# Patient Record
Sex: Male | Born: 1960 | Race: Asian | Marital: Single | Smoking: Never smoker
Health system: Southern US, Community
[De-identification: ages and names within clinical notes are randomized; demographics above are authoritative.]

## PROBLEM LIST (undated history)

## (undated) DIAGNOSIS — I10 Essential (primary) hypertension: Secondary | ICD-10-CM

## (undated) DIAGNOSIS — E119 Type 2 diabetes mellitus without complications: Secondary | ICD-10-CM

## (undated) DIAGNOSIS — E785 Hyperlipidemia, unspecified: Secondary | ICD-10-CM

## (undated) HISTORY — DX: Essential (primary) hypertension: I10

## (undated) HISTORY — DX: Hyperlipidemia, unspecified: E78.5

## (undated) HISTORY — DX: Type 2 diabetes mellitus without complications: E11.9

---

## 2013-06-15 ENCOUNTER — Ambulatory Visit: Payer: Self-pay | Admitting: Family Medicine

## 2013-06-15 VITALS — BP 96/70 | HR 102 | Temp 98.0°F | Resp 20 | Ht 64.65 in | Wt 178.5 lb

## 2013-06-15 DIAGNOSIS — L255 Unspecified contact dermatitis due to plants, except food: Secondary | ICD-10-CM

## 2013-06-15 DIAGNOSIS — L237 Allergic contact dermatitis due to plants, except food: Secondary | ICD-10-CM

## 2013-06-15 DIAGNOSIS — I10 Essential (primary) hypertension: Secondary | ICD-10-CM | POA: Insufficient documentation

## 2013-06-15 MED ORDER — HYDROCHLOROTHIAZIDE 25 MG PO TABS: 25.0000 mg | ORAL_TABLET | Freq: Every day | ORAL | Status: DC

## 2013-06-15 MED ORDER — METHYLPREDNISOLONE ACETATE 80 MG/ML IJ SUSP
80.0000 mg | Freq: Once | INTRAMUSCULAR | Status: AC
  Administered 2013-06-15: 80 mg via INTRAMUSCULAR

## 2013-06-15 NOTE — Progress Notes (Signed)
° °  Subjective:    Patient ID: Albert Collins, male    DOB: November 22, 1960, 53 y.o.   MRN: [id number]  Poison Ivy Pertinent negatives include no fever.   Chief Complaint  Patient presents with   Poison Ivy    Arms, legs & torso   This chart was scribed for Elvina Sidle, MD by Andrew Au, ED Scribe. This patient was seen in room 2 and the patient's care was started at 5:00 PM.  HPI Comments: Albert Collins is a 53 y.o. male who presents to the [facility] complaining of poison ivy located on pt bilateral arms, legs and torso onset 1 week. Pt reports he was working out in the yard at that time. Pt denies rash being in between fingers. Pt is requesting the shot.  Pt reports he has HTN and is requesting a medication refill.   No past medical history on file. No Known Allergies Prior to Admission medications   Not on File   Review of Systems  Constitutional: Negative for fever and chills.  Skin: Positive for rash.    Objective:   Physical Exam  Nursing note and vitals reviewed. Constitutional: He is oriented to person, place, and time. He appears well-developed and well-nourished. No distress.  HENT:  Head: Normocephalic and atraumatic.  Eyes: EOM are normal.  Neck: Neck supple. No tracheal deviation present.  Cardiovascular: Normal rate.   Pulmonary/Chest: Effort normal.  Musculoskeletal: Normal range of motion.  Neurological: He is alert and oriented to person, place, and time.  Skin: Skin is warm and dry.  Vesicular papular rash forearms, abdomen, and lower extremities.  Psychiatric: He has a normal mood and affect. His behavior is normal.   Assessment & Plan:    Poison ivy - Plan: methylPREDNISolone acetate (DEPO-MEDROL) injection 80 mg  Hypertension - Plan: hydrochlorothiazide (HYDRODIURIL) 25 MG tablet  Signed, Elvina Sidle, MD

## 2014-04-01 ENCOUNTER — Ambulatory Visit (INDEPENDENT_AMBULATORY_CARE_PROVIDER_SITE_OTHER): Payer: Self-pay | Admitting: Family Medicine

## 2014-04-01 VITALS — BP 128/84 | HR 112 | Temp 98.0°F | Resp 18 | Ht 65.5 in | Wt 171.0 lb

## 2014-04-01 DIAGNOSIS — E119 Type 2 diabetes mellitus without complications: Secondary | ICD-10-CM

## 2014-04-01 DIAGNOSIS — I10 Essential (primary) hypertension: Secondary | ICD-10-CM

## 2014-04-01 DIAGNOSIS — Z131 Encounter for screening for diabetes mellitus: Secondary | ICD-10-CM

## 2014-04-01 DIAGNOSIS — M25512 Pain in left shoulder: Secondary | ICD-10-CM

## 2014-04-01 LAB — POCT GLYCOSYLATED HEMOGLOBIN (HGB A1C): HEMOGLOBIN A1C: 10

## 2014-04-01 LAB — POCT URINALYSIS DIPSTICK
Bilirubin, UA: NEGATIVE
Blood, UA: NEGATIVE
Glucose, UA: 500
KETONES UA: NEGATIVE
Leukocytes, UA: NEGATIVE
Nitrite, UA: NEGATIVE
PH UA: 5.5
Spec Grav, UA: 1.01
Urobilinogen, UA: 0.2

## 2014-04-01 LAB — GLUCOSE, POCT (MANUAL RESULT ENTRY)

## 2014-04-01 MED ORDER — MELOXICAM 7.5 MG PO TABS: 7.5000 mg | ORAL_TABLET | Freq: Every day | ORAL | Status: DC

## 2014-04-01 MED ORDER — LISINOPRIL 10 MG PO TABS: 10.0000 mg | ORAL_TABLET | Freq: Every day | ORAL | Status: DC

## 2014-04-01 MED ORDER — HYDROCHLOROTHIAZIDE 25 MG PO TABS: 25.0000 mg | ORAL_TABLET | Freq: Every day | ORAL | Status: DC

## 2014-04-01 MED ORDER — METFORMIN HCL 1000 MG PO TABS: 1000.0000 mg | ORAL_TABLET | Freq: Two times a day (BID) | ORAL | Status: DC

## 2014-04-01 NOTE — Patient Instructions (Signed)
- Please start Metformin to manage your newly diagnosed Diabetes.  Week 1: Take  (1/2 tablet) twice a day with food.  Week 2: Take 1,000mg  (1 tablet) in the morning with food and  (1/2 tablet) in the evening with food.  Week 3: Take 1,000mg  twice daily with food. - Please return for follow up in 3 months. - If you start,    Type 2 Diabetes Mellitus Type 2 diabetes mellitus, often simply referred to as type 2 diabetes, is a long-lasting (chronic) disease. In type 2 diabetes, the pancreas does not make enough insulin (a hormone), the cells are less responsive to the insulin that is made (insulin resistance), or both. Normally, insulin moves sugars from food into the tissue cells. The tissue cells use the sugars for energy. The lack of insulin or the lack of normal response to insulin causes excess sugars to build up in the blood instead of going into the tissue cells. As a result, high blood sugar (hyperglycemia) develops. The effect of high sugar (glucose) levels can cause many complications. Type 2 diabetes was also previously called adult-onset diabetes, but it can occur at any age.  RISK FACTORS  A person is predisposed to developing type 2 diabetes if someone in the family has the disease and also has one or more of the following primary risk factors:  Overweight.  An inactive lifestyle.  A history of consistently eating high-calorie foods. Maintaining a normal weight and regular physical activity can reduce the chance of developing type 2 diabetes. SYMPTOMS  A person with type 2 diabetes may not show symptoms initially. The symptoms of type 2 diabetes appear slowly. The symptoms include:  Increased thirst (polydipsia).  Increased urination (polyuria).  Increased urination during the night (nocturia).  Weight loss. This weight loss may be rapid.  Frequent, recurring infections.  Tiredness (fatigue).  Weakness.  Vision changes, such as blurred vision.  Fruity  smell to your breath.  Abdominal pain.  Nausea or vomiting.  Cuts or bruises which are slow to heal.  Tingling or numbness in the hands or feet. DIAGNOSIS Type 2 diabetes is frequently not diagnosed until complications of diabetes are present. Type 2 diabetes is diagnosed when symptoms or complications are present and when blood glucose levels are increased. Your blood glucose level may be checked by one or more of the following blood tests:  A fasting blood glucose test. You will not be allowed to eat for at least 8 hours before a blood sample is taken.  A random blood glucose test. Your blood glucose is checked at any time of the day regardless of when you ate.  A hemoglobin A1c blood glucose test. A hemoglobin A1c test provides information about blood glucose control over the previous 3 months.  An oral glucose tolerance test (OGTT). Your blood glucose is measured after you have not eaten (fasted) for 2 hours and then after you drink a glucose-containing beverage. TREATMENT   You may need to take insulin or diabetes medicine daily to keep blood glucose levels in the desired range.  If you use insulin, you may need to adjust the dosage depending on the carbohydrates that you eat with each meal or snack. The treatment goal is to maintain the before meal blood sugar (preprandial glucose) level at 70-130 mg/dL. HOME CARE INSTRUCTIONS   Have your hemoglobin A1c level checked twice a year.  Perform daily blood glucose monitoring as directed by your health care provider.  Monitor urine ketones when you are  ill and as directed by your health care provider.  Take your diabetes medicine or insulin as directed by your health care provider to maintain your blood glucose levels in the desired range.  Never run out of diabetes medicine or insulin. It is needed every day.  If you are using insulin, you may need to adjust the amount of insulin given based on your intake of carbohydrates.  Carbohydrates can raise blood glucose levels but need to be included in your diet. Carbohydrates provide vitamins, minerals, and fiber which are an essential part of a healthy diet. Carbohydrates are found in fruits, vegetables, whole grains, dairy products, legumes, and foods containing added sugars.  Eat healthy foods. You should make an appointment to see a registered dietitian to help you create an eating plan that is right for you.  Lose weight if you are overweight.  Carry a medical alert card or wear your medical alert jewelry.  Carry a 15-gram carbohydrate snack with you at all times to treat low blood glucose (hypoglycemia). Some examples of 15-gram carbohydrate snacks include:  Glucose tablets, 3 or 4.  Glucose gel, 15-gram tube.  Raisins, 2 tablespoons (24 grams).  Jelly beans, 6.  Animal crackers, 8.  Regular pop, 4 ounces (120 mL).  Gummy treats, 9.  Recognize hypoglycemia. Hypoglycemia occurs with blood glucose levels of 70 mg/dL and below. The risk for hypoglycemia increases when fasting or skipping meals, during or after intense exercise, and during sleep. Hypoglycemia symptoms can include:  Tremors or shakes.  Decreased ability to concentrate.  Sweating.  Increased heart rate.  Headache.  Dry mouth.  Hunger.  Irritability.  Anxiety.  Restless sleep.  Altered speech or coordination.  Confusion.  Treat hypoglycemia promptly. If you are alert and able to safely swallow, follow the 15:15 rule:  Take 15-20 grams of rapid-acting glucose or carbohydrate. Rapid-acting options include glucose gel, glucose tablets, or 4 ounces (120 mL) of fruit juice, regular soda, or low-fat milk.  Check your blood glucose level 15 minutes after taking the glucose.  Take 15-20 grams more of glucose if the repeat blood glucose level is still 70 mg/dL or below.  Eat a meal or snack within 1 hour once blood glucose levels return to normal.  Be alert to feeling very  thirsty and urinating more frequently than usual, which are early signs of hyperglycemia. An early awareness of hyperglycemia allows for prompt treatment. Treat hyperglycemia as directed by your health care provider.  Engage in at least 150 minutes of moderate-intensity physical activity a week, spread over at least 3 days of the week or as directed by your health care provider. In addition, you should engage in resistance exercise at least 2 times a week or as directed by your health care provider. Try to spend no more than 90 minutes at one time inactive.  Adjust your medicine and food intake as needed if you start a new exercise or sport.  Follow your sick-day plan anytime you are unable to eat or drink as usual.  Do not use any tobacco products including cigarettes, chewing tobacco, or electronic cigarettes. If you need help quitting, ask your health care provider.  Limit alcohol intake to no more than 1 drink per day for nonpregnant women and 2 drinks per day for men. You should drink alcohol only when you are also eating food. Talk with your health care provider whether alcohol is safe for you. Tell your health care provider if you drink alcohol several times a  week.  Keep all follow-up visits as directed by your health care provider. This is important.  Schedule an eye exam soon after the diagnosis of type 2 diabetes and then annually.  Perform daily skin and foot care. Examine your skin and feet daily for cuts, bruises, redness, nail problems, bleeding, blisters, or sores. A foot exam by a health care provider should be done annually.  Brush your teeth and gums at least twice a day and floss at least once a day. Follow up with your dentist regularly.  Share your diabetes management plan with your workplace or school.  Stay up-to-date with immunizations. It is recommended that people with diabetes who are over 53 years old get the pneumonia vaccine. In some cases, two separate shots may  be given. Ask your health care provider if your pneumonia vaccination is up-to-date.  Learn to manage stress.  Obtain ongoing diabetes education and support as needed.  Participate in or seek rehabilitation as needed to maintain or improve independence and quality of life. Request a physical or occupational therapy referral if you are having foot or hand numbness, or difficulties with grooming, dressing, eating, or physical activity. SEEK MEDICAL CARE IF:   You are unable to eat food or drink fluids for more than 6 hours.  You have nausea and vomiting for more than 6 hours.  Your blood glucose level is over 240 mg/dL.  There is a change in mental status.  You develop an additional serious illness.  You have diarrhea for more than 6 hours.  You have been sick or have had a fever for a couple of days and are not getting better.  You have pain during any physical activity.  SEEK IMMEDIATE MEDICAL CARE IF:  You have difficulty breathing.  You have moderate to large ketone levels. MAKE SURE YOU:  Understand these instructions.  Will watch your condition.  Will get help right away if you are not doing well or get worse. Document Released: 01/02/2005 Document Revised: 05/19/2013 Document Reviewed: 08/01/2011 [facility] Patient Information 2015 [employer] [place]. This information is not intended to replace advice given to you by your health care provider. Make sure you discuss any questions you have with your health care provider.

## 2014-04-01 NOTE — Progress Notes (Signed)
Have reviewed note and examined pt along with Mr. Albert Collins, agree with his assessment and plan.  Suspect that although glucose is quite high pt is well compensated and does not require admission,  Await BMP to calculate anion gap.  No ketones in urine however.  Rechecked pulse and found to be approx 108

## 2014-04-01 NOTE — Progress Notes (Signed)
MRN: [id number] DOB: 04/23/1960  Subjective:   Albert Collins is a 54 y.o. male presenting for chief complaint of dm check  Diabetes - [place], urine sample was checked and advised that he should be evaluated for diabetes. Works as a Hospital doctordriver, eats mix of foods, leans meats, rice, vegetables, drinks plenty of water, no sodas. Patient exercises 15-30 minutes walking or jogging daily. Denies polyuria, polydipsia, polyphagia, numbness, tingling, cuts or wounds that won't heal, blurred vision. Denies family history of diabetes. Denies smoking, 1-2 beers on the weekends. Denies any other aggravating or relieving factors, no other questions or concerns.  HTN - diagnosed with HTN 2006, managed with HCT, needs a refill of his medication, takes this daily. Avoids salt in his diet. Diet and exercise as above. Denies lightheadedness, dizziness, chest pain, shob, lower leg swelling.   Shoulder pain - reports 2 week history of left shoulder pain. Patient remembers lifting a 20lb bag of ice and felt pain since then. Pain is sharp, intermittent, occurs daily, does not radiate, elicited with movement. He has tried Advil and Tylenol with minimal relief. Denies history of trauma, injury or shoulder surgery.  Manus GunningChak has a current medication list which includes the following prescription(s): hydrochlorothiazide. He has No Known Allergies.  Manus GunningChak  has a past medical history of Hypertension and Diabetes mellitus without complication. Also  has no past surgical history on file.  ROS As in subjective.  Objective:   Vitals: BP 128/84 mmHg  Pulse 112  Temp(Src) 98 F (36.7 C) (Oral)  Resp 18  Ht 5' 5.5" (1.664 m)  Wt 171 lb (77.565 kg)  BMI 28.01 kg/m2  SpO2 96%  Pulse 96 on recheck by Dr. Patsy Lageropland at 17:10.  Physical Exam  Constitutional: He is oriented to person, place, and time and well-developed, well-nourished, and in no distress.  Neck: Normal range of motion.  No  spasms or spinous process tenderness.  Cardiovascular: Regular rhythm and intact distal pulses.  Exam reveals no gallop and no friction rub.   No murmur heard. Pulmonary/Chest: No respiratory distress. He has no wheezes. He has no rales. He exhibits no tenderness.  Abdominal: Soft. Bowel sounds are normal. He exhibits no distension and no mass. There is no tenderness.  Musculoskeletal:       Left shoulder: He exhibits normal range of motion, no tenderness, no bony tenderness, no crepitus, no deformity, no pain and no spasm.  Negative Neers and Hawkins test. Strength 5/5.  Neurological: He is alert and oriented to person, place, and time.   Results for orders placed or performed in visit on 04/01/14 (from the past 24 hour(s))  POCT glucose (manual entry)     Status: None   Collection Time: 04/01/14  5:00 PM  Result Value Ref Range   POC Glucose >=444 70 - 99 mg/dl  POCT urinalysis dipstick     Status: None   Collection Time: 04/01/14  5:00 PM  Result Value Ref Range   Color, UA yellow    Clarity, UA clear    Glucose, UA 500    Bilirubin, UA neg    Ketones, UA neg    Spec Grav, UA 1.010    Blood, UA neg    pH, UA 5.5    Protein, UA trace    Urobilinogen, UA 0.2    Nitrite, UA neg    Leukocytes, UA Negative   POCT glycosylated hemoglobin (Hb A1C)     Status:  None   Collection Time: 04/01/14  5:10 PM  Result Value Ref Range   Hemoglobin A1C 10.0    Assessment and Plan :   1. Essential hypertension - Controlled, continue HCT and add lisinopril for renal protection - Continue healthy diet and exercise - When making recommendations for dietary modifications, patient admitted that he drinks 6-7 beers on Saturday and Sunday, also admits ~2 drinks of alcohol daily during weekdays. Advised that he cut back on his drinking and eventually stop all together. Patient agreed. - Follow up in 1 month  2. Diabetes mellitus screening 3. New onset type 2 diabetes mellitus - Start Metformin,  titrate up to 2,000mg  daily. - Diet, exercise, follow up as above. - Labs pending, consider hospitalization if blood sugar is >700  4. Pain in joint, shoulder region, left - Meloxicam as needed  Wallis Bamberg, PA-C [facility] [phone] 04/01/2014 4:20 PM

## 2014-04-02 LAB — BASIC METABOLIC PANEL
BUN: 14 mg/dL (ref 6–23)
CO2: 23 meq/L (ref 19–32)
Calcium: 9.9 mg/dL (ref 8.4–10.5)
Chloride: 95 mEq/L — ABNORMAL LOW (ref 96–112)
Creat: 1.11 mg/dL (ref 0.50–1.35)
Glucose, Bld: 411 mg/dL — ABNORMAL HIGH (ref 70–99)
Potassium: 3.8 mEq/L (ref 3.5–5.3)
Sodium: 130 mEq/L — ABNORMAL LOW (ref 135–145)

## 2014-04-03 ENCOUNTER — Telehealth: Payer: Self-pay | Admitting: Urgent Care

## 2014-04-03 NOTE — Telephone Encounter (Signed)
Spoke with patient regarding results and progress. Patient states that he is doing fine, no change in symptoms. He started Metformin. Will continue with this medication and BP meds. Follow up in 1 month.  Albert BambergMario Imran Nuon, PA-C [facility] [phone] 04/03/2014  2:34 PM

## 2014-08-24 ENCOUNTER — Other Ambulatory Visit: Payer: Self-pay | Admitting: Urgent Care

## 2014-08-25 NOTE — Telephone Encounter (Signed)
I'll give 1 more refill but this medication is not meant to be used long term. Please let patient know that we should re-evaluate him if his joint pain persists. Thank you!

## 2014-08-25 NOTE — Telephone Encounter (Signed)
Mike, do you want to give RFs? 

## 2015-12-06 ENCOUNTER — Emergency Department (HOSPITAL_COMMUNITY)
Admission: EM | Admit: 2015-12-06 | Discharge: 2015-12-06 | Disposition: A | Discharge status: Home / Self Care | Payer: BLUE CROSS/BLUE SHIELD | Attending: Emergency Medicine | Admitting: Emergency Medicine

## 2015-12-06 ENCOUNTER — Encounter (HOSPITAL_COMMUNITY): Payer: Self-pay | Admitting: Vascular Surgery

## 2015-12-06 ENCOUNTER — Emergency Department (HOSPITAL_COMMUNITY): Payer: BLUE CROSS/BLUE SHIELD

## 2015-12-06 DIAGNOSIS — Z79899 Other long term (current) drug therapy: Secondary | ICD-10-CM | POA: Diagnosis not present

## 2015-12-06 DIAGNOSIS — Y939 Activity, unspecified: Secondary | ICD-10-CM | POA: Insufficient documentation

## 2015-12-06 DIAGNOSIS — S3992XA Unspecified injury of lower back, initial encounter: Secondary | ICD-10-CM | POA: Insufficient documentation

## 2015-12-06 DIAGNOSIS — Z7984 Long term (current) use of oral hypoglycemic drugs: Secondary | ICD-10-CM | POA: Diagnosis not present

## 2015-12-06 DIAGNOSIS — M542 Cervicalgia: Secondary | ICD-10-CM | POA: Diagnosis not present

## 2015-12-06 DIAGNOSIS — Y92410 Unspecified street and highway as the place of occurrence of the external cause: Secondary | ICD-10-CM | POA: Insufficient documentation

## 2015-12-06 DIAGNOSIS — E119 Type 2 diabetes mellitus without complications: Secondary | ICD-10-CM | POA: Diagnosis not present

## 2015-12-06 DIAGNOSIS — I10 Essential (primary) hypertension: Secondary | ICD-10-CM | POA: Insufficient documentation

## 2015-12-06 DIAGNOSIS — Y999 Unspecified external cause status: Secondary | ICD-10-CM | POA: Diagnosis not present

## 2015-12-06 MED ORDER — IBUPROFEN 400 MG PO TABS
600.0000 mg | ORAL_TABLET | Freq: Once | ORAL | Status: AC
  Administered 2015-12-06: 600 mg via ORAL
  Filled 2015-12-06: qty 1

## 2015-12-06 MED ORDER — TRAMADOL HCL 50 MG PO TABS: 50.0000 mg | ORAL_TABLET | Freq: Four times a day (QID) | ORAL | 0 refills | Status: DC | PRN

## 2015-12-06 MED ORDER — NAPROXEN 500 MG PO TABS: 500.0000 mg | ORAL_TABLET | Freq: Two times a day (BID) | ORAL | 0 refills | Status: DC

## 2015-12-06 MED ORDER — METHOCARBAMOL 500 MG PO TABS: 500.0000 mg | ORAL_TABLET | Freq: Two times a day (BID) | ORAL | 0 refills | Status: DC

## 2015-12-06 NOTE — ED Notes (Signed)
Patient transported to X-ray 
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
## 2015-12-06 NOTE — ED Notes (Signed)
D/C papers reviewed and PA explained DC instructions. Understanding verbalized

## 2015-12-06 NOTE — Discharge Instructions (Signed)
Take your medications as prescribed as needed for pain relief. I also recommend applying ice and/or heat to affected area for 15-20 minutes 3-4 times daily for additional pain relief. Refrain from doing any heavy lifting, squatting or repetitive movements that exacerbate her symptoms for the next few days. Please follow up with a primary care provider from the Resource Guide provided below in one week if your symptoms have not improved. Please return to the Emergency Department if symptoms worsen or new onset of fever, numbness, tingling, groin anesthesia, loss of bowel or bladder, weakness, chest pain, difficulty breathing, abdominal pain,urinary retention.

## 2015-12-15 ENCOUNTER — Encounter: Payer: Self-pay | Admitting: Internal Medicine

## 2015-12-15 ENCOUNTER — Other Ambulatory Visit: Payer: Self-pay | Admitting: *Deleted

## 2015-12-15 ENCOUNTER — Ambulatory Visit: Payer: Self-pay | Attending: Internal Medicine | Admitting: Internal Medicine

## 2015-12-15 VITALS — BP 120/84 | HR 102 | Temp 98.2°F | Resp 18 | Ht 65.0 in | Wt 181.2 lb

## 2015-12-15 DIAGNOSIS — M545 Low back pain, unspecified: Secondary | ICD-10-CM

## 2015-12-15 MED ORDER — NAPROXEN 500 MG PO TABS: 500.0000 mg | ORAL_TABLET | Freq: Two times a day (BID) | ORAL | 0 refills | Status: DC

## 2015-12-15 MED ORDER — TRAMADOL HCL 50 MG PO TABS: 50.0000 mg | ORAL_TABLET | Freq: Four times a day (QID) | ORAL | 0 refills | Status: DC | PRN
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
## 2015-12-15 NOTE — Patient Instructions (Signed)
I suspect you r back pain will slowly resolve over the next 4-6 weeks.  Use ice alternating with heat as you are able.  Gradually increase your activity.

## 2015-12-15 NOTE — Addendum Note (Signed)
Addended by: Margaretmary LombardLISBON, Rasheedah Reis K on: 12/15/2015 03:46 PM   Modules accepted: Orders

## 2015-12-15 NOTE — Telephone Encounter (Signed)
Medication E-prescribed to the pharmacy.

## 2015-12-28 ENCOUNTER — Encounter (HOSPITAL_COMMUNITY): Payer: Self-pay | Admitting: *Deleted

## 2015-12-28 ENCOUNTER — Emergency Department (HOSPITAL_COMMUNITY): Payer: BLUE CROSS/BLUE SHIELD

## 2015-12-28 ENCOUNTER — Emergency Department (HOSPITAL_COMMUNITY)
Admission: EM | Admit: 2015-12-28 | Discharge: 2015-12-28 | Disposition: A | Discharge status: Home / Self Care | Payer: BLUE CROSS/BLUE SHIELD | Attending: Emergency Medicine | Admitting: Emergency Medicine

## 2015-12-28 DIAGNOSIS — Z79899 Other long term (current) drug therapy: Secondary | ICD-10-CM | POA: Diagnosis not present

## 2015-12-28 DIAGNOSIS — E119 Type 2 diabetes mellitus without complications: Secondary | ICD-10-CM | POA: Insufficient documentation

## 2015-12-28 DIAGNOSIS — Z7984 Long term (current) use of oral hypoglycemic drugs: Secondary | ICD-10-CM | POA: Insufficient documentation

## 2015-12-28 DIAGNOSIS — M06831 Other specified rheumatoid arthritis, right wrist: Secondary | ICD-10-CM | POA: Insufficient documentation

## 2015-12-28 DIAGNOSIS — I10 Essential (primary) hypertension: Secondary | ICD-10-CM | POA: Insufficient documentation

## 2015-12-28 DIAGNOSIS — M1990 Unspecified osteoarthritis, unspecified site: Secondary | ICD-10-CM

## 2015-12-28 DIAGNOSIS — M25531 Pain in right wrist: Secondary | ICD-10-CM | POA: Diagnosis present

## 2015-12-28 LAB — CBC WITH DIFFERENTIAL/PLATELET
BASOS ABS: 0 10*3/uL (ref 0.0–0.1)
BASOS PCT: 0 %
EOS PCT: 0 %
Eosinophils Absolute: 0 10*3/uL (ref 0.0–0.7)
HCT: 43.7 % (ref 39.0–52.0)
Hemoglobin: 15.2 g/dL (ref 13.0–17.0)
LYMPHS PCT: 26 %
Lymphs Abs: 3.2 10*3/uL (ref 0.7–4.0)
MCH: 27.8 pg (ref 26.0–34.0)
MCHC: 34.8 g/dL (ref 30.0–36.0)
MCV: 79.9 fL (ref 78.0–100.0)
Monocytes Absolute: 1.2 10*3/uL — ABNORMAL HIGH (ref 0.1–1.0)
Monocytes Relative: 10 %
Neutro Abs: 8 10*3/uL — ABNORMAL HIGH (ref 1.7–7.7)
Neutrophils Relative %: 64 %
PLATELETS: 202 10*3/uL (ref 150–400)
RBC: 5.47 MIL/uL (ref 4.22–5.81)
RDW: 13 % (ref 11.5–15.5)
WBC: 12.4 10*3/uL — AB (ref 4.0–10.5)

## 2015-12-28 LAB — BASIC METABOLIC PANEL
ANION GAP: 12 (ref 5–15)
BUN: 15 mg/dL (ref 6–20)
CO2: 21 mmol/L — ABNORMAL LOW (ref 22–32)
Calcium: 9.4 mg/dL (ref 8.9–10.3)
Chloride: 96 mmol/L — ABNORMAL LOW (ref 101–111)
Creatinine, Ser: 1.02 mg/dL (ref 0.61–1.24)
Glucose, Bld: 291 mg/dL — ABNORMAL HIGH (ref 65–99)
POTASSIUM: 4 mmol/L (ref 3.5–5.1)
SODIUM: 129 mmol/L — AB (ref 135–145)

## 2015-12-28 LAB — C-REACTIVE PROTEIN: CRP: 5 mg/dL — AB (ref <1.0)

## 2015-12-28 LAB — SEDIMENTATION RATE: SED RATE: 28 mm/h — AB (ref 0–16)

## 2015-12-28 LAB — URIC ACID: URIC ACID, SERUM: 5.9 mg/dL (ref 4.4–7.6)

## 2015-12-28 MED ORDER — CEPHALEXIN 250 MG PO CAPS
500.0000 mg | ORAL_CAPSULE | Freq: Once | ORAL | Status: AC
  Administered 2015-12-28: 500 mg via ORAL
  Filled 2015-12-28: qty 2

## 2015-12-28 MED ORDER — HYDROCODONE-ACETAMINOPHEN 5-325 MG PO TABS
1.0000 | ORAL_TABLET | ORAL | Status: AC
  Administered 2015-12-28: 1 via ORAL
  Filled 2015-12-28: qty 1

## 2015-12-28 MED ORDER — ETODOLAC 500 MG PO TABS: 500.0000 mg | ORAL_TABLET | Freq: Two times a day (BID) | ORAL | 0 refills | Status: DC

## 2015-12-28 MED ORDER — CEPHALEXIN 500 MG PO CAPS: 500.0000 mg | ORAL_CAPSULE | Freq: Four times a day (QID) | ORAL | 0 refills | Status: DC

## 2015-12-28 MED ORDER — HYDROCODONE-ACETAMINOPHEN 5-325 MG PO TABS: 1.0000 | ORAL_TABLET | ORAL | 0 refills | Status: DC | PRN
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
## 2015-12-28 NOTE — Progress Notes (Signed)
Orthopedic Tech Progress Note Patient Details:  Albert Collins 01/15/1961 [id number]  Ortho Devices Type of Ortho Device: Velcro wrist splint Ortho Device/Splint Location: RUE Ortho Device/Splint Interventions: Ordered, Application   Jennye MoccasinHughes, Latausha Flamm Craig 12/28/2015, 3:13 PM

## 2015-12-28 NOTE — ED Notes (Signed)
Pt is aware of high blood pressure. Stated that he did not take his BP meds this morning.

## 2015-12-28 NOTE — Discharge Instructions (Signed)
Follow up with your primary care doctor later this week to be rechecked, take the medications as prescribed, return for fever, worsening symptoms
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
## 2015-12-28 NOTE — ED Notes (Signed)
Pt verbalized understanding discharge instructions and denies any further needs or questions at this time. VS stable, ambulatory and steady gait.   
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
## 2020-08-09 ENCOUNTER — Other Ambulatory Visit: Payer: Self-pay | Admitting: Physician Assistant

## 2020-08-09 DIAGNOSIS — R7989 Other specified abnormal findings of blood chemistry: Secondary | ICD-10-CM

## 2020-08-24 ENCOUNTER — Other Ambulatory Visit: Payer: Self-pay

## 2020-08-24 ENCOUNTER — Ambulatory Visit
Admission: RE | Admit: 2020-08-24 | Discharge: 2020-08-24 | Disposition: A | Discharge status: Home / Self Care | Payer: 59 | Source: Ambulatory Visit | Attending: Physician Assistant | Admitting: Physician Assistant

## 2020-08-24 DIAGNOSIS — R7989 Other specified abnormal findings of blood chemistry: Secondary | ICD-10-CM

## 2022-01-26 ENCOUNTER — Encounter: Payer: Self-pay | Admitting: Family Medicine

## 2022-01-26 ENCOUNTER — Ambulatory Visit: Payer: 59 | Admitting: Family Medicine

## 2022-01-26 VITALS — BP 156/94 | HR 70 | Temp 97.0°F | Ht 65.0 in | Wt 160.0 lb

## 2022-01-26 DIAGNOSIS — R051 Acute cough: Secondary | ICD-10-CM | POA: Diagnosis not present

## 2022-01-26 DIAGNOSIS — I10 Essential (primary) hypertension: Secondary | ICD-10-CM

## 2022-01-26 DIAGNOSIS — E119 Type 2 diabetes mellitus without complications: Secondary | ICD-10-CM | POA: Diagnosis not present

## 2022-01-26 DIAGNOSIS — K7581 Nonalcoholic steatohepatitis (NASH): Secondary | ICD-10-CM | POA: Insufficient documentation

## 2022-01-26 DIAGNOSIS — R0981 Nasal congestion: Secondary | ICD-10-CM

## 2022-01-26 DIAGNOSIS — M1A0720 Idiopathic chronic gout, left ankle and foot, without tophus (tophi): Secondary | ICD-10-CM

## 2022-01-26 DIAGNOSIS — I152 Hypertension secondary to endocrine disorders: Secondary | ICD-10-CM | POA: Insufficient documentation

## 2022-01-26 DIAGNOSIS — E782 Mixed hyperlipidemia: Secondary | ICD-10-CM

## 2022-01-26 MED ORDER — FLUTICASONE PROPIONATE 50 MCG/ACT NA SUSP: 2.0000 | Freq: Every day | NASAL | 6 refills | Status: DC

## 2022-01-26 MED ORDER — METFORMIN HCL 1000 MG PO TABS: 1000.0000 mg | ORAL_TABLET | Freq: Two times a day (BID) | ORAL | 3 refills | Status: DC

## 2022-01-26 MED ORDER — ALLOPURINOL 300 MG PO TABS: 300.0000 mg | ORAL_TABLET | Freq: Every day | ORAL | 0 refills | Status: DC

## 2022-01-26 MED ORDER — BLOOD GLUCOSE METER KIT: PACK | 0 refills | Status: DC

## 2022-01-26 MED ORDER — LISINOPRIL 10 MG PO TABS: 10.0000 mg | ORAL_TABLET | Freq: Every day | ORAL | 0 refills | Status: DC

## 2022-01-26 MED ORDER — BENZONATATE 200 MG PO CAPS: 200.0000 mg | ORAL_CAPSULE | Freq: Two times a day (BID) | ORAL | 0 refills | Status: DC | PRN

## 2022-01-26 MED ORDER — ROSUVASTATIN CALCIUM 40 MG PO TABS: 40.0000 mg | ORAL_TABLET | Freq: Every day | ORAL | 0 refills | Status: DC

## 2022-01-26 MED ORDER — LISINOPRIL 10 MG PO TABS: 10.0000 mg | ORAL_TABLET | Freq: Every day | ORAL | 3 refills | Status: DC

## 2022-01-26 MED ORDER — METFORMIN HCL 1000 MG PO TABS: 1000.0000 mg | ORAL_TABLET | Freq: Two times a day (BID) | ORAL | 0 refills | Status: DC

## 2022-01-26 NOTE — Progress Notes (Signed)
Assessment/Plan:   Problem List Items Addressed This Visit       Cardiovascular and Mediastinum   Essential hypertension    The patient will continue taking lisinopril 10 mg daily. He should monitor his blood pressure at home to ensure better management. It is recommended that he follows up in 2-3 months to recheck his blood pressure and review his medication efficacy and adherence. A repeat lipid panel and comprehensive metabolic panel will be ordered for his next visit.  Differential diagnosis: Consider secondary causes of hypertension if blood pressure remains uncontrolled on current management.  Plan: Encourage lifestyle modifications including diet and exercise. Continue with current medication and monitor blood pressure at home. Follow-up in 2-3 months for a re-evaluation of blood pressure and possible medication adjustment if needed.      Relevant Medications   lisinopril (ZESTRIL) 10 MG tablet   rosuvastatin (CRESTOR) 40 MG tablet   Other Relevant Orders   TSH   Lipid panel   Hemoglobin A1c   Microalbumin / creatinine urine ratio   Urinalysis, Routine w reflex microscopic   CBC with Differential/Platelet   Comprehensive metabolic panel     Endocrine   Type 2 diabetes mellitus without complication, without long-term current use of insulin (HCC) - Primary    The patient has been managing his diabetes with metformin. His glycemic control seems adequate based on the recent A1c of 7. He will continue with metformin and will commence the use of CBD as he desires, although there is no clinical evidence to support this as a treatment for diabetes. The patient will acquire a new blood glucose monitoring kit and is advised to maintain a log of his blood sugars, especially with his intention to use CBD. He will follow-up for routine diabetes monitoring.  Differential diagnosis: Exacerbation of diabetes control should be considered if A1c rises or there are signs of end-organ  damage.  Plan: Continue metformin. Provide education regarding the role of lifestyle management in diabetes. Dispense a new blood glucose monitor kit. Educate on the importance of regular blood glucose monitoring. Schedule follow-up in 3 months to review glycemic control and diabetes management.      Relevant Medications   metFORMIN (GLUCOPHAGE) 1000 MG tablet   blood glucose meter kit and supplies   lisinopril (ZESTRIL) 10 MG tablet   rosuvastatin (CRESTOR) 40 MG tablet   Other Relevant Orders   TSH   Lipid panel   Hemoglobin A1c   Microalbumin / creatinine urine ratio   Urinalysis, Routine w reflex microscopic   CBC with Differential/Platelet   Comprehensive metabolic panel     Musculoskeletal and Integument   Idiopathic chronic gout of left foot without tophus   Relevant Medications   allopurinol (ZYLOPRIM) 300 MG tablet   Other Relevant Orders   TSH   Lipid panel   Hemoglobin A1c   Microalbumin / creatinine urine ratio   Urinalysis, Routine w reflex microscopic   CBC with Differential/Platelet   Comprehensive metabolic panel   Uric acid     Other   Mixed hyperlipidemia   Relevant Medications   lisinopril (ZESTRIL) 10 MG tablet   rosuvastatin (CRESTOR) 40 MG tablet   Other Relevant Orders   TSH   Lipid panel   Hemoglobin A1c   Microalbumin / creatinine urine ratio   Urinalysis, Routine w reflex microscopic   CBC with Differential/Platelet   Comprehensive metabolic panel   Nasal congestion    Patient reports a runny nose and mild cough following  a flu-like illness one week ago. Over-the-counter medicines were previously obtained for symptomatic relief. Prescribe fluticasone nasal spray for congestion and benzonatate capsules for cough as needed.  Plan: Advise the patient to use nasal spray and cough medication as prescribed. Recommend rest, hydration, and symptomatic management with over-the-counter medications. Follow-up as needed or if symptoms worsen       Relevant Medications   fluticasone (FLONASE) 50 MCG/ACT nasal spray   Other Visit Diagnoses     New onset type 2 diabetes mellitus (HCC)       Relevant Medications   metFORMIN (GLUCOPHAGE) 1000 MG tablet   lisinopril (ZESTRIL) 10 MG tablet   rosuvastatin (CRESTOR) 40 MG tablet   Acute cough       Relevant Medications   benzonatate (TESSALON) 200 MG capsule       Medications Discontinued During This Encounter  Medication Reason   cephALEXin (KEFLEX) 500 MG capsule    etodolac (LODINE) 500 MG tablet    hydrochlorothiazide (HYDRODIURIL) 25 MG tablet    HYDROcodone-acetaminophen (NORCO/VICODIN) 5-325 MG tablet    traMADol (ULTRAM) 50 MG tablet    naproxen (NAPROSYN) 500 MG tablet    metFORMIN (GLUCOPHAGE) 1000 MG tablet Reorder   lisinopril (PRINIVIL,ZESTRIL) 10 MG tablet Reorder   allopurinol (ZYLOPRIM) 300 MG tablet Reorder   rosuvastatin (CRESTOR) 40 MG tablet Reorder   allopurinol (ZYLOPRIM) 300 MG tablet Reorder   lisinopril (ZESTRIL) 10 MG tablet Reorder   metFORMIN (GLUCOPHAGE) 1000 MG tablet Reorder   rosuvastatin (CRESTOR) 40 MG tablet Reorder   benzonatate (TESSALON) 200 MG capsule Reorder   fluticasone (FLONASE) 50 MCG/ACT nasal spray Reorder   allopurinol (ZYLOPRIM) 300 MG tablet Reorder   lisinopril (ZESTRIL) 10 MG tablet Reorder   rosuvastatin (CRESTOR) 40 MG tablet Reorder   blood glucose meter kit and supplies Reorder      Subjective:  HPI: Encounter date: 01/26/2022  Jacquise Halpin is a 62 y.o. male who has Type 2 diabetes mellitus without complication, without long-term current use of insulin ([place]); NASH (nonalcoholic steatohepatitis); Mixed hyperlipidemia; Idiopathic chronic gout of left foot without tophus; Essential hypertension; and Nasal congestion on their problem list..   He  has a past medical history of Diabetes mellitus without complication ([place]), Hyperlipidemia, and Hypertension..    CHIEF COMPLAINT: The patient presents today for an  establishment of care and reports nasal congestion present for one week.  HISTORY OF PRESENT ILLNESS:  Problem 1: The patient is a 62 year old male with a history of essential hypertension, currently managed with lisinopril. He mentions no recent changes in symptoms or medication side effects, but his blood pressure reading today is slightly elevated at 162/96. He is not currently monitoring his blood pressure at home. Blood work from a previous visit suggests adequate control of his condition. He also experienced flu-like symptoms approximately one week prior, which he believes may contribute to his elevated blood pressure due to residual congestion.  Problem 2: The patient also has type 2 diabetes mellitus without complication, which is usually controlled with metformin. His hemoglobin A1c was around 7 on his last check, indicating good diabetes control. Despite seeing promotional materials suggesting CBD as a potential cure for diabetes, the patient is advised to continue with his current management plan. He will continue with metformin and has decided to try CBD concurrently, monitoring the effects without stopping his prescribed medication.  REVIEW OF SYSTEMS: Cardiovascular: No chest pain, shortness of breath, or leg swelling. Respiratory: Residual cough and nasal congestion from recent flu-like  illness, no wheezing on examination. GI: No abdominal pain or nausea reported. GU: No urinary symptoms. MSK: No acute joint pain or gout flares. Neuro: No headaches, dizziness, or neurologic deficits noted. ENT: No ear pain or hearing loss. Runny nose noted. All other systems are reviewed and negative.  History reviewed. No pertinent surgical history.  Outpatient Medications Prior to Visit  Medication Sig Dispense Refill   allopurinol (ZYLOPRIM) 300 MG tablet 1 tab(s) orally once a day for 90 days     lisinopril (PRINIVIL,ZESTRIL) 10 MG tablet Take 1 tablet (10 mg total) by mouth daily. 90 tablet 3    metFORMIN (GLUCOPHAGE) 1000 MG tablet Take 1 tablet (1,000 mg total) by mouth 2 (two) times daily with a meal. 90 tablet 3   naproxen (NAPROSYN) 500 MG tablet Take 1 tablet (500 mg total) by mouth 2 (two) times daily. 30 tablet 0   rosuvastatin (CRESTOR) 40 MG tablet Take 40 mg by mouth daily.     cephALEXin (KEFLEX) 500 MG capsule Take 1 capsule (500 mg total) by mouth 4 (four) times daily. (Patient not taking: Reported on 01/26/2022) 28 capsule 0   etodolac (LODINE) 500 MG tablet Take 1 tablet (500 mg total) by mouth 2 (two) times daily. (Patient not taking: Reported on 01/26/2022) 20 tablet 0   hydrochlorothiazide (HYDRODIURIL) 25 MG tablet Take 1 tablet (25 mg total) by mouth daily. (Patient not taking: Reported on 01/26/2022) 90 tablet 3   HYDROcodone-acetaminophen (NORCO/VICODIN) 5-325 MG tablet Take 1 tablet by mouth every 4 (four) hours as needed. (Patient not taking: Reported on 01/26/2022) 12 tablet 0   traMADol (ULTRAM) 50 MG tablet Take 1 tablet (50 mg total) by mouth every 6 (six) hours as needed. (Patient not taking: Reported on 01/26/2022) 10 tablet 0   No facility-administered medications prior to visit.    History reviewed. No pertinent family history.  Social History   Socioeconomic History   Marital status: Single    Spouse name: Not on file   Number of children: Not on file   Years of education: Not on file   Highest education level: Not on file  Occupational History   Not on file  Tobacco Use   Smoking status: Never    Passive exposure: Never   Smokeless tobacco: Never  Vaping Use   Vaping Use: Never used  Substance and Sexual Activity   Alcohol use: Yes    Comment: occ   Drug use: No   Sexual activity: Not on file  Other Topics Concern   Not on file  Social History Narrative   Not on file   Social Determinants of Health   Financial Resource Strain: Not on file  Food Insecurity: Not on file  Transportation Needs: Not on file  Physical Activity: Not on file   Stress: Not on file  Social Connections: Not on file  Intimate Partner Violence: Not on file                                                                                                 Objective:  Physical Exam: BP Marland Kitchen)  156/94 (BP Location: Left Arm, Patient Position: Sitting, Cuff Size: Large)   Pulse 70   Temp (!) 97 F (36.1 C) (Temporal)   Ht 5\' 5"  (1.651 m)   Wt 160 lb (72.6 kg)   SpO2 97%   BMI 26.63 kg/m    General: No acute distress. Awake and conversant.  Eyes: Normal conjunctiva, anicteric. Round symmetric pupils.  ENT: Hearing grossly intact. No nasal discharge.  Neck: Neck is supple. No masses or thyromegaly.  Respiratory: Respirations are non-labored. No auditory wheezing.  Skin: Warm. No rashes or ulcers.  Psych: Alert and oriented. Cooperative, Appropriate mood and affect, Normal judgment.  CV: No cyanosis or JVD MSK: Normal ambulation. No clubbing  Neuro: Sensation and CN II-XII grossly normal.  ABD: Soft, non-tender, non-distended, no organomegaly       Alesia Banda, MD, MS

## 2022-01-26 NOTE — Patient Instructions (Signed)
Patient Instructions:  Continue taking your current medications as prescribed. This includes:  Metformin for diabetes Lisinopril for high blood pressure Medications for cholesterol management and gout prevention Monitor your blood glucose levels using the new blood sugar monitor that will be provided to you. Keep a log of your results to discuss at your next appointment.  Do not stop taking Metformin or any other prescribed medications without consulting your healthcare provider. While you are considering trying CBD oil, be aware that it is not a proven treatment for diabetes, and stopping your medication can pose risks to your health.  For your recent flu symptoms, continue using the over-the-counter cold and flu medication that has been helping. A prescription for a nasal spray and cough medicine will be given to address the congestion and cough if needed.  A follow-up appointment is advised in 2-3 months to conduct fasting blood work, reassess blood pressure, and review diabetes management. Please schedule this for March as requested.  For the Department of Transportation (DOT) physical examination, you will need to see a certified provider. You can contact the provider who previously did this for you or seek a certified provider through [facility] or at a location of your choice in [place].  It is also time for you to receive your next COVID-19 vaccine booster. Please schedule this appropriately.  If any severe symptoms arise or if you have any urgent concerns, don't hesitate to contact your healthcare provider or seek immediate medical care.  If you have any questions or issues with your medications, appointments, or health concerns, please contact the clinic for assistance. Remember to maintain a balanced diet, regular physical activity, and adequate hydration, which are essential components of managing your health conditions.

## 2022-01-30 DIAGNOSIS — R0981 Nasal congestion: Secondary | ICD-10-CM | POA: Insufficient documentation

## 2022-01-30 NOTE — Assessment & Plan Note (Signed)
The patient will continue taking lisinopril 10 mg daily. He should monitor his blood pressure at home to ensure better management. It is recommended that he follows up in 2-3 months to recheck his blood pressure and review his medication efficacy and adherence. A repeat lipid panel and comprehensive metabolic panel will be ordered for his next visit.  Differential diagnosis: Consider secondary causes of hypertension if blood pressure remains uncontrolled on current management.  Plan: Encourage lifestyle modifications including diet and exercise. Continue with current medication and monitor blood pressure at home. Follow-up in 2-3 months for a re-evaluation of blood pressure and possible medication adjustment if needed.

## 2022-01-30 NOTE — Assessment & Plan Note (Signed)
The patient has been managing his diabetes with metformin. His glycemic control seems adequate based on the recent A1c of 7. He will continue with metformin and will commence the use of CBD as he desires, although there is no clinical evidence to support this as a treatment for diabetes. The patient will acquire a new blood glucose monitoring kit and is advised to maintain a log of his blood sugars, especially with his intention to use CBD. He will follow-up for routine diabetes monitoring.  Differential diagnosis: Exacerbation of diabetes control should be considered if A1c rises or there are signs of end-organ damage.  Plan: Continue metformin. Provide education regarding the role of lifestyle management in diabetes. Dispense a new blood glucose monitor kit. Educate on the importance of regular blood glucose monitoring. Schedule follow-up in 3 months to review glycemic control and diabetes management.

## 2022-01-30 NOTE — Assessment & Plan Note (Signed)
Patient reports a runny nose and mild cough following a flu-like illness one week ago. Over-the-counter medicines were previously obtained for symptomatic relief. Prescribe fluticasone nasal spray for congestion and benzonatate capsules for cough as needed.  Plan: Advise the patient to use nasal spray and cough medication as prescribed. Recommend rest, hydration, and symptomatic management with over-the-counter medications. Follow-up as needed or if symptoms worsen

## 2022-02-17 ENCOUNTER — Other Ambulatory Visit: Payer: Self-pay | Admitting: Family Medicine

## 2022-02-17 NOTE — Telephone Encounter (Signed)
Chart supports rx. Last OV: 01/26/2022 Next OV: 05/15/2022

## 2022-04-19 ENCOUNTER — Other Ambulatory Visit: Payer: Self-pay | Admitting: Family Medicine

## 2022-04-19 ENCOUNTER — Telehealth: Payer: Self-pay | Admitting: Family Medicine

## 2022-04-19 DIAGNOSIS — R0981 Nasal congestion: Secondary | ICD-10-CM

## 2022-04-19 DIAGNOSIS — I10 Essential (primary) hypertension: Secondary | ICD-10-CM

## 2022-04-19 DIAGNOSIS — E119 Type 2 diabetes mellitus without complications: Secondary | ICD-10-CM

## 2022-04-19 DIAGNOSIS — E782 Mixed hyperlipidemia: Secondary | ICD-10-CM

## 2022-04-19 DIAGNOSIS — M1A0720 Idiopathic chronic gout, left ankle and foot, without tophus (tophi): Secondary | ICD-10-CM

## 2022-04-19 MED ORDER — ROSUVASTATIN CALCIUM 40 MG PO TABS
40.0000 mg | ORAL_TABLET | Freq: Every day | ORAL | 0 refills | Status: DC
Start: 2022-04-19 — End: 2022-04-28

## 2022-04-19 MED ORDER — ALLOPURINOL 300 MG PO TABS
300.0000 mg | ORAL_TABLET | Freq: Every day | ORAL | 0 refills | Status: DC
Start: 2022-04-19 — End: 2022-04-28

## 2022-04-19 NOTE — Telephone Encounter (Signed)
Chart supports rx. Last OV: 01/26/2022 Next OV: 05/15/2022  

## 2022-04-19 NOTE — Telephone Encounter (Signed)
Chart supports rx. Last OV: 01/26/2022 Next OV: 05/15/2022  Patient already picked up lisinopril refill on 04/17/2022

## 2022-04-19 NOTE — Telephone Encounter (Signed)
Last OV: 01/26/22 Next OV: 05/12/22 Medication:  Lisinopril 10mg , Allopurinol 300 mg, and Rosuvastatin 40 mg Pharmacy: [facility] on [address] states he is currently in town and wanted OV to refill meds. Informed him that PCP out of office. States he will try to make 4/29 OV but needs refill now.

## 2022-04-28 ENCOUNTER — Ambulatory Visit: Payer: 59 | Admitting: Family Medicine

## 2022-04-28 VITALS — BP 138/78 | HR 83 | Temp 97.6°F | Ht 65.0 in | Wt 161.6 lb

## 2022-04-28 DIAGNOSIS — I10 Essential (primary) hypertension: Secondary | ICD-10-CM

## 2022-04-28 DIAGNOSIS — E782 Mixed hyperlipidemia: Secondary | ICD-10-CM

## 2022-04-28 DIAGNOSIS — I152 Hypertension secondary to endocrine disorders: Secondary | ICD-10-CM | POA: Diagnosis not present

## 2022-04-28 DIAGNOSIS — Z1159 Encounter for screening for other viral diseases: Secondary | ICD-10-CM

## 2022-04-28 DIAGNOSIS — K7581 Nonalcoholic steatohepatitis (NASH): Secondary | ICD-10-CM

## 2022-04-28 DIAGNOSIS — M1A0720 Idiopathic chronic gout, left ankle and foot, without tophus (tophi): Secondary | ICD-10-CM

## 2022-04-28 DIAGNOSIS — R0981 Nasal congestion: Secondary | ICD-10-CM

## 2022-04-28 DIAGNOSIS — Z5181 Encounter for therapeutic drug level monitoring: Secondary | ICD-10-CM | POA: Diagnosis not present

## 2022-04-28 DIAGNOSIS — E1159 Type 2 diabetes mellitus with other circulatory complications: Secondary | ICD-10-CM

## 2022-04-28 DIAGNOSIS — Z125 Encounter for screening for malignant neoplasm of prostate: Secondary | ICD-10-CM | POA: Diagnosis not present

## 2022-04-28 DIAGNOSIS — E119 Type 2 diabetes mellitus without complications: Secondary | ICD-10-CM

## 2022-04-28 DIAGNOSIS — D509 Iron deficiency anemia, unspecified: Secondary | ICD-10-CM

## 2022-04-28 LAB — COMPREHENSIVE METABOLIC PANEL
ALT: 19 U/L (ref 0–53)
AST: 28 U/L (ref 0–37)
Albumin: 4.5 g/dL (ref 3.5–5.2)
Alkaline Phosphatase: 51 U/L (ref 39–117)
BUN: 18 mg/dL (ref 6–23)
CO2: 25 mEq/L (ref 19–32)
Calcium: 9.2 mg/dL (ref 8.4–10.5)
Chloride: 108 mEq/L (ref 96–112)
Creatinine, Ser: 1.01 mg/dL (ref 0.40–1.50)
GFR: 80.4 mL/min (ref >60.00)
Glucose, Bld: 149 mg/dL — ABNORMAL HIGH (ref 70–99)
Potassium: 4.2 mEq/L (ref 3.5–5.1)
Sodium: 143 mEq/L (ref 135–145)
Total Bilirubin: 0.5 mg/dL (ref 0.2–1.2)
Total Protein: 7.8 g/dL (ref 6.0–8.3)

## 2022-04-28 LAB — URINALYSIS, ROUTINE W REFLEX MICROSCOPIC
Bilirubin Urine: NEGATIVE
Hgb urine dipstick: NEGATIVE
Ketones, ur: NEGATIVE
Leukocytes,Ua: NEGATIVE
Nitrite: NEGATIVE
Specific Gravity, Urine: 1.025 (ref 1.000–1.030)
Urine Glucose: NEGATIVE
Urobilinogen, UA: 1 (ref 0.0–1.0)
pH: 6 (ref 5.0–8.0)

## 2022-04-28 LAB — TSH: TSH: 1.39 u[IU]/mL (ref 0.35–5.50)

## 2022-04-28 LAB — B12 AND FOLATE PANEL
Folate: 10.5 ng/mL (ref >5.9)
Vitamin B-12: 413 pg/mL (ref 211–911)

## 2022-04-28 LAB — CBC WITH DIFFERENTIAL/PLATELET
Basophils Absolute: 0 10*3/uL (ref 0.0–0.1)
Basophils Relative: 0.3 % (ref 0.0–3.0)
Eosinophils Absolute: 0.3 10*3/uL (ref 0.0–0.7)
Eosinophils Relative: 3.4 % (ref 0.0–5.0)
HCT: 36.2 % — ABNORMAL LOW (ref 39.0–52.0)
Hemoglobin: 11.4 g/dL — ABNORMAL LOW (ref 13.0–17.0)
Lymphocytes Relative: 43.2 % (ref 12.0–46.0)
Lymphs Abs: 3.4 10*3/uL (ref 0.7–4.0)
MCHC: 31.5 g/dL (ref 30.0–36.0)
MCV: 74.1 fl — ABNORMAL LOW (ref 78.0–100.0)
Monocytes Absolute: 0.7 10*3/uL (ref 0.1–1.0)
Monocytes Relative: 8.6 % (ref 3.0–12.0)
Neutro Abs: 3.5 10*3/uL (ref 1.4–7.7)
Neutrophils Relative %: 44.5 % (ref 43.0–77.0)
Platelets: 215 10*3/uL (ref 150.0–400.0)
RBC: 4.88 Mil/uL (ref 4.22–5.81)
RDW: 19.3 % — ABNORMAL HIGH (ref 11.5–15.5)
WBC: 7.8 10*3/uL (ref 4.0–10.5)

## 2022-04-28 LAB — POCT GLYCOSYLATED HEMOGLOBIN (HGB A1C): Hemoglobin A1C: 6.8 % — AB (ref 4.0–5.6)

## 2022-04-28 LAB — LIPID PANEL
Cholesterol: 126 mg/dL (ref 0–200)
HDL: 45.8 mg/dL (ref >39.00)
NonHDL: 80.62
Total CHOL/HDL Ratio: 3
Triglycerides: 211 mg/dL — ABNORMAL HIGH (ref 0.0–149.0)
VLDL: 42.2 mg/dL — ABNORMAL HIGH (ref 0.0–40.0)

## 2022-04-28 LAB — MICROALBUMIN / CREATININE URINE RATIO
Creatinine,U: 149.7 mg/dL
Microalb Creat Ratio: 10.9 mg/g (ref 0.0–30.0)
Microalb, Ur: 16.3 mg/dL — ABNORMAL HIGH (ref 0.0–1.9)

## 2022-04-28 LAB — URIC ACID: Uric Acid, Serum: 4 mg/dL (ref 4.0–7.8)

## 2022-04-28 LAB — PSA: PSA: 1.66 ng/mL (ref 0.10–4.00)

## 2022-04-28 LAB — LDL CHOLESTEROL, DIRECT: Direct LDL: 60 mg/dL

## 2022-04-28 MED ORDER — ALLOPURINOL 300 MG PO TABS
300.0000 mg | ORAL_TABLET | Freq: Every day | ORAL | 1 refills | Status: DC
Start: 2022-04-28 — End: 2022-12-12

## 2022-04-28 MED ORDER — ROSUVASTATIN CALCIUM 40 MG PO TABS
40.0000 mg | ORAL_TABLET | Freq: Every day | ORAL | 0 refills | Status: DC
Start: 2022-04-28 — End: 2022-12-12

## 2022-04-28 MED ORDER — ACCU-CHEK GUIDE VI STRP
ORAL_STRIP | 3 refills | Status: AC
Start: 2022-04-28 — End: ?

## 2022-04-28 MED ORDER — BLOOD GLUCOSE METER KIT
PACK | 0 refills | Status: AC
Start: 2022-04-28 — End: ?

## 2022-04-28 MED ORDER — ALLOPURINOL 300 MG PO TABS
300.0000 mg | ORAL_TABLET | Freq: Every day | ORAL | 0 refills | Status: DC
Start: 2022-04-28 — End: 2022-04-28

## 2022-04-28 MED ORDER — FLUTICASONE PROPIONATE 50 MCG/ACT NA SUSP
NASAL | 6 refills | Status: AC
Start: 2022-04-28 — End: ?

## 2022-04-28 MED ORDER — METFORMIN HCL 1000 MG PO TABS
1000.0000 mg | ORAL_TABLET | Freq: Two times a day (BID) | ORAL | 0 refills | Status: DC
Start: 2022-04-28 — End: 2023-02-27

## 2022-04-28 MED ORDER — LISINOPRIL-HYDROCHLOROTHIAZIDE 10-12.5 MG PO TABS
1.0000 | ORAL_TABLET | Freq: Every day | ORAL | 3 refills | Status: DC
Start: 2022-04-28 — End: 2023-05-09
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
## 2022-04-28 NOTE — Assessment & Plan Note (Signed)
Patient's diabetes remains under control  Continue Metformin 1000 MG twice daily with meals. Maintain routine blood glucose monitoring. Schedule annual diabetic eye exam to monitor for retinopathy (referred to ophthalmology).

## 2022-04-28 NOTE — Assessment & Plan Note (Signed)
Slightly elevated BP at 138/78 mmHg noted. Plan to adjust Antihypertensive medication to include Lisinopril-hydrochlorothiazide combination to achieve better BP control. Monitor blood pressure and adjust medication as needed.

## 2022-04-28 NOTE — Patient Instructions (Addendum)
We refilled your medications and checking labs as discussed.  For blood pressure, we are adding HCTZ to lisinopril.

## 2022-04-29 LAB — HEPATITIS C ANTIBODY: Hepatitis C Ab: NONREACTIVE

## 2022-05-02 LAB — VITAMIN D 1,25 DIHYDROXY
Vitamin D 1, 25 (OH)2 Total: 60 pg/mL (ref 18–72)
Vitamin D2 1, 25 (OH)2: <8 pg/mL
Vitamin D3 1, 25 (OH)2: 60 pg/mL

## 2022-05-02 LAB — HIV ANTIBODY (ROUTINE TESTING W REFLEX): HIV 1&2 Ab, 4th Generation: NONREACTIVE

## 2022-05-04 NOTE — Addendum Note (Signed)
Addended by: Fanny Bien B on: 05/04/2022 12:48 PM   Modules accepted: Orders

## 2022-05-12 ENCOUNTER — Other Ambulatory Visit (INDEPENDENT_AMBULATORY_CARE_PROVIDER_SITE_OTHER): Payer: 59

## 2022-05-12 ENCOUNTER — Other Ambulatory Visit: Payer: 59

## 2022-05-12 DIAGNOSIS — I10 Essential (primary) hypertension: Secondary | ICD-10-CM

## 2022-05-12 DIAGNOSIS — E782 Mixed hyperlipidemia: Secondary | ICD-10-CM | POA: Diagnosis not present

## 2022-05-12 DIAGNOSIS — M1A0720 Idiopathic chronic gout, left ankle and foot, without tophus (tophi): Secondary | ICD-10-CM

## 2022-05-12 DIAGNOSIS — E119 Type 2 diabetes mellitus without complications: Secondary | ICD-10-CM | POA: Diagnosis not present

## 2022-05-12 DIAGNOSIS — D509 Iron deficiency anemia, unspecified: Secondary | ICD-10-CM | POA: Diagnosis not present

## 2022-05-12 DIAGNOSIS — D508 Other iron deficiency anemias: Secondary | ICD-10-CM

## 2022-05-12 LAB — CBC WITH DIFFERENTIAL/PLATELET
Basophils Absolute: 0 10*3/uL (ref 0.0–0.1)
Basophils Relative: 0.2 % (ref 0.0–3.0)
Eosinophils Absolute: 0.3 10*3/uL (ref 0.0–0.7)
Eosinophils Relative: 4.2 % (ref 0.0–5.0)
HCT: 34.1 % — ABNORMAL LOW (ref 39.0–52.0)
Hemoglobin: 10.8 g/dL — ABNORMAL LOW (ref 13.0–17.0)
Lymphocytes Relative: 43.2 % (ref 12.0–46.0)
Lymphs Abs: 3.3 10*3/uL (ref 0.7–4.0)
MCHC: 31.8 g/dL (ref 30.0–36.0)
MCV: 73.2 fl — ABNORMAL LOW (ref 78.0–100.0)
Monocytes Absolute: 0.7 10*3/uL (ref 0.1–1.0)
Monocytes Relative: 9.1 % (ref 3.0–12.0)
Neutro Abs: 3.3 10*3/uL (ref 1.4–7.7)
Neutrophils Relative %: 43.3 % (ref 43.0–77.0)
Platelets: 210 10*3/uL (ref 150.0–400.0)
RBC: 4.65 Mil/uL (ref 4.22–5.81)
RDW: 18.5 % — ABNORMAL HIGH (ref 11.5–15.5)
WBC: 7.7 10*3/uL (ref 4.0–10.5)

## 2022-05-12 LAB — HEMOGLOBIN A1C: Hgb A1c MFr Bld: 7.1 % — ABNORMAL HIGH (ref 4.6–6.5)

## 2022-05-12 LAB — C-REACTIVE PROTEIN: CRP: <1 mg/dL (ref 0.5–20.0)

## 2022-05-12 LAB — SEDIMENTATION RATE: Sed Rate: 42 mm/hr — ABNORMAL HIGH (ref 0–20)

## 2022-05-13 LAB — IRON,TIBC AND FERRITIN PANEL
%SAT: 7 % (calc) — ABNORMAL LOW (ref 20–48)
Ferritin: 5 ng/mL — ABNORMAL LOW (ref 24–380)
Iron: 33 ug/dL — ABNORMAL LOW (ref 50–180)
TIBC: 465 mcg/dL (calc) — ABNORMAL HIGH (ref 250–425)

## 2022-05-13 LAB — HGB FRACTIONATION CASCADE

## 2022-05-15 ENCOUNTER — Ambulatory Visit: Payer: 59 | Admitting: Family Medicine

## 2022-05-15 LAB — PATHOLOGIST SMEAR REVIEW

## 2022-05-15 MED ORDER — IRON (FERROUS SULFATE) 325 (65 FE) MG PO TABS
325.0000 mg | ORAL_TABLET | Freq: Every day | ORAL | Status: AC
Start: 2022-05-15 — End: ?

## 2022-05-15 NOTE — Addendum Note (Signed)
Addended by: Garnette Gunner on: 05/15/2022 08:46 AM   Modules accepted: Orders

## 2022-05-18 ENCOUNTER — Ambulatory Visit: Payer: 59 | Admitting: Internal Medicine

## 2022-05-18 LAB — CELIAC DISEASE AB SCREEN W/RFX
Antigliadin Abs, IgA: 8 units (ref 0–19)
IgA/Immunoglobulin A, Serum: 382 mg/dL (ref 61–437)
Transglutaminase IgA: <2 U/mL (ref 0–3)

## 2022-05-18 LAB — HGB FRACTIONATION CASCADE
Hgb A2: 2.4 % (ref 1.8–3.2)
Hgb F: 0 % (ref 0.0–2.0)
Hgb S: 0 %

## 2022-10-27 ENCOUNTER — Telehealth: Payer: Self-pay | Admitting: Family Medicine

## 2022-10-27 ENCOUNTER — Ambulatory Visit: Payer: 59 | Admitting: Family Medicine

## 2022-10-27 DIAGNOSIS — E119 Type 2 diabetes mellitus without complications: Secondary | ICD-10-CM

## 2022-10-27 NOTE — Telephone Encounter (Signed)
 1st no show, letter sent via mail

## 2022-10-27 NOTE — Telephone Encounter (Signed)
Pt is a no show for an OV with Dr Janee Morn on 10/27/22, I sent a letter.

## 2022-12-12 ENCOUNTER — Other Ambulatory Visit: Payer: Self-pay | Admitting: Family Medicine

## 2022-12-12 DIAGNOSIS — E119 Type 2 diabetes mellitus without complications: Secondary | ICD-10-CM

## 2022-12-12 DIAGNOSIS — M1A0720 Idiopathic chronic gout, left ankle and foot, without tophus (tophi): Secondary | ICD-10-CM

## 2022-12-12 DIAGNOSIS — E782 Mixed hyperlipidemia: Secondary | ICD-10-CM

## 2022-12-12 DIAGNOSIS — I10 Essential (primary) hypertension: Secondary | ICD-10-CM

## 2022-12-12 NOTE — Telephone Encounter (Signed)
Sent note to pharmacy Patient needs an appointment for future refills

## 2023-01-12 ENCOUNTER — Other Ambulatory Visit: Payer: Self-pay | Admitting: Family Medicine

## 2023-01-12 DIAGNOSIS — I10 Essential (primary) hypertension: Secondary | ICD-10-CM

## 2023-01-12 DIAGNOSIS — E119 Type 2 diabetes mellitus without complications: Secondary | ICD-10-CM

## 2023-01-12 DIAGNOSIS — E782 Mixed hyperlipidemia: Secondary | ICD-10-CM

## 2023-01-12 DIAGNOSIS — M1A0720 Idiopathic chronic gout, left ankle and foot, without tophus (tophi): Secondary | ICD-10-CM

## 2023-02-14 ENCOUNTER — Other Ambulatory Visit: Payer: Self-pay | Admitting: Family Medicine

## 2023-02-14 DIAGNOSIS — E119 Type 2 diabetes mellitus without complications: Secondary | ICD-10-CM

## 2023-02-27 ENCOUNTER — Other Ambulatory Visit: Payer: Self-pay | Admitting: Family Medicine

## 2023-02-27 DIAGNOSIS — E119 Type 2 diabetes mellitus without complications: Secondary | ICD-10-CM

## 2023-05-09 ENCOUNTER — Other Ambulatory Visit: Payer: Self-pay | Admitting: Family Medicine

## 2023-05-09 ENCOUNTER — Telehealth: Payer: Self-pay

## 2023-05-09 DIAGNOSIS — I10 Essential (primary) hypertension: Secondary | ICD-10-CM

## 2023-05-09 DIAGNOSIS — E119 Type 2 diabetes mellitus without complications: Secondary | ICD-10-CM

## 2023-05-09 NOTE — Telephone Encounter (Signed)
 Spoke to Pt in regards to RX request for lisinopril . I did advise Pt that a follow up OV was needed. LOV was on 04/28/2023 with FOV (around 10/28/2022) for HTN and DM. Pt no showed in October 2024 for appointment. RX for 30 day supply was sent and OV was made for 06/13/2023 (fasting). Pt verbalized understanding and requested a appointment reminder 1-2 weeks prior to appointment.

## 2023-06-13 ENCOUNTER — Ambulatory Visit: Admitting: Family Medicine

## 2023-06-18 ENCOUNTER — Ambulatory Visit: Admitting: Family Medicine

## 2023-06-25 ENCOUNTER — Telehealth: Payer: Self-pay | Admitting: Family Medicine

## 2023-06-25 NOTE — Telephone Encounter (Signed)
 06/18/2023 no show 10/27/2022 no show  Final warning sent via mail and sent text

## 2023-07-02 ENCOUNTER — Encounter: Payer: Self-pay | Admitting: Family Medicine

## 2023-07-02 ENCOUNTER — Ambulatory Visit: Admitting: Family Medicine

## 2023-07-02 VITALS — BP 138/80 | HR 91 | Temp 97.7°F | Ht 65.0 in | Wt 175.6 lb

## 2023-07-02 DIAGNOSIS — E119 Type 2 diabetes mellitus without complications: Secondary | ICD-10-CM

## 2023-07-02 DIAGNOSIS — Z7984 Long term (current) use of oral hypoglycemic drugs: Secondary | ICD-10-CM

## 2023-07-02 DIAGNOSIS — E782 Mixed hyperlipidemia: Secondary | ICD-10-CM | POA: Diagnosis not present

## 2023-07-02 DIAGNOSIS — I10 Essential (primary) hypertension: Secondary | ICD-10-CM | POA: Diagnosis not present

## 2023-07-02 LAB — POCT GLYCOSYLATED HEMOGLOBIN (HGB A1C): Hemoglobin A1C: 7.5 % — AB (ref 4.0–5.6)

## 2023-07-02 MED ORDER — METFORMIN HCL 1000 MG PO TABS: 1000.0000 mg | ORAL_TABLET | Freq: Two times a day (BID) | ORAL | 1 refills | Status: DC

## 2023-07-02 MED ORDER — ROSUVASTATIN CALCIUM 40 MG PO TABS: 40.0000 mg | ORAL_TABLET | Freq: Every day | ORAL | 1 refills | Status: AC

## 2023-07-02 MED ORDER — LISINOPRIL-HYDROCHLOROTHIAZIDE 10-12.5 MG PO TABS: 1.0000 | ORAL_TABLET | Freq: Every day | ORAL | 1 refills | Status: DC

## 2023-07-02 NOTE — Progress Notes (Signed)
 Assessment & Plan   Assessment/Plan:     Assessment and Plan Assessment & Plan Type 2 Diabetes Mellitus HbA1c increased from 6.8% to 7.5%, indicating worsening glycemic control. Currently on metformin  1000 mg once daily, although prescribed twice daily. Asymptomatic with no polyuria or polydipsia. Emphasized importance of glycemic control to prevent complications such as nephropathy, retinopathy, and neuropathy. Encouraged lifestyle modifications, including regular exercise and a balanced diet with more vegetables than fruits, while avoiding high-sugar foods and drinks. Discussed potential progression of diabetes despite lifestyle efforts and considered adding pioglitazone or glipizide if control does not improve. Discussed potential use of injectables or newer oral medications like Rybelsus or SGLT2 inhibitors, noting cost considerations. - Increase metformin  to 1000 mg twice daily - Recheck HbA1c in 3 months - Perform fasting lab work at next follow-up to assess renal function, hepatic function, lipid profile, and uric acid levels  Hypertension Blood pressure is 138/80 mmHg. Has not taken lisinopril /hydrochlorothiazide  10-12.5 MG for two weeks due to running out of medication. Lisinopril  provides renal protection beyond blood pressure control. - Restart lisinopril  and hydrochlorothiazide  10-12.5 MG daily - Refill lisinopril  and hydrochlorothiazide  prescriptions  Hyperlipidemia On rosuvastatin  40 mg for lipid management. Plans to recheck lipid levels at the next follow-up. - Continue rosuvastatin  40 mg daily, refilled - Recheck lipid levels in 3 months  Gout No recent flair. Previously on allopurinol  - Consider restarting allopurinol  300 mg daily if uric acid > 6.0 on recheck   General Health Maintenance Emphasized importance of regular ophthalmologic exams to monitor for diabetes-related complications. - Ensure regular ophthalmologic exams  Follow-up Monitor diabetes management  and other health parameters. - Schedule follow-up appointment in 3 months - Perform fasting lab work at next follow-up - Ensure medication refills are sent to the pharmacy      Medications Discontinued During This Encounter  Medication Reason   allopurinol  (ZYLOPRIM ) 300 MG tablet    fluticasone  (FLONASE ) 50 MCG/ACT nasal spray    rosuvastatin  (CRESTOR ) 40 MG tablet Reorder   metFORMIN  (GLUCOPHAGE ) 1000 MG tablet Reorder   lisinopril -hydrochlorothiazide  (ZESTORETIC ) 10-12.5 MG tablet Reorder            Subjective:   Encounter date: 07/02/2023  Albert Collins is a 63 y.o. male who has Type 2 diabetes mellitus without complication, without long-term current use of insulin (HCC); NASH (nonalcoholic steatohepatitis); Mixed hyperlipidemia; Idiopathic chronic gout of left foot without tophus; Hypertension associated with diabetes (HCC); and Nasal congestion on their problem list..   He  has a past medical history of Diabetes mellitus without complication (HCC), Hyperlipidemia, and Hypertension.Albert Collins   He presents with chief complaint of Follow-up .   Discussed the use of AI scribe software for clinical note transcription with the patient, who gave verbal consent to proceed.  History of Present Illness Albert Collins is a 63 year old male with hypertension and diabetes who presents for follow-up on blood pressure and diabetes management.  Recent lab work indicates an increase in A1c from 6.8% to 7.5%. He has no symptoms of increased thirst or urination. He is currently taking metformin  1000 mg once daily, although it was prescribed to be taken twice daily. He maintains regular physical activity, including weightlifting and walking for about an hour, four to five days a week. His diet includes fruits and vegetables, while avoiding starches and sugary foods.  He has not taken his blood pressure medication, lisinopril , for the past two weeks due to running out of the medication. His blood  pressure today is  138/80 mmHg.  He has a history of gout, managed with allopurinol , and reports no recent flares. He is also on rosuvastatin  40 mg for cholesterol management. He recently had an eye exam and reports no vision problems, stating 'I don't wear glasses anymore.'  No urinary symptoms or abdominal pain. He feels 'normal' and has no issues with daily activities.     ROS  No past surgical history on file.  Outpatient Medications Prior to Visit  Medication Sig Dispense Refill   blood glucose meter kit and supplies Dispense based on patient and insurance preference. Use up to four times daily as directed. (FOR ICD-10 E10.9, E11.9). 1 each 0   glucose blood (ACCU-CHEK GUIDE) test strip USE 4 TIMES A DAY TO TEST BLOOD GLUCOSE 100 strip 3   allopurinol  (ZYLOPRIM ) 300 MG tablet TAKE 1 TABLET BY MOUTH EVERY DAY 30 tablet 0   metFORMIN  (GLUCOPHAGE ) 1000 MG tablet TAKE 1 TABLET (1,000 MG TOTAL) BY MOUTH TWICE A DAY WITH FOOD 60 tablet 2   Iron , Ferrous Sulfate , 325 (65 Fe) MG TABS Take 325 mg by mouth daily.     fluticasone  (FLONASE ) 50 MCG/ACT nasal spray SPRAY 2 SPRAYS INTO EACH NOSTRIL EVERY DAY (Patient not taking: Reported on 07/02/2023) 16 mL 6   lisinopril -hydrochlorothiazide  (ZESTORETIC ) 10-12.5 MG tablet TAKE 1 TABLET BY MOUTH EVERY DAY (Patient not taking: Reported on 07/02/2023) 30 tablet 32   rosuvastatin  (CRESTOR ) 40 MG tablet TAKE 1 TABLET BY MOUTH EVERY DAY (Patient not taking: Reported on 07/02/2023) 30 tablet 0   No facility-administered medications prior to visit.    No family history on file.  Social History   Socioeconomic History   Marital status: Single    Spouse name: Not on file   Number of children: Not on file   Years of education: Not on file   Highest education level: Not on file  Occupational History   Not on file  Tobacco Use   Smoking status: Never    Passive exposure: Never   Smokeless tobacco: Never  Vaping Use   Vaping status: Never Used   Substance and Sexual Activity   Alcohol use: Yes    Comment: occ   Drug use: No   Sexual activity: Not on file  Other Topics Concern   Not on file  Social History Narrative   Not on file   Social Drivers of Health   Financial Resource Strain: Not on file  Food Insecurity: Not on file  Transportation Needs: Not on file  Physical Activity: Not on file  Stress: Not on file  Social Connections: Not on file  Intimate Partner Violence: Not on file                                                                                                  Objective:  Physical Exam: BP 138/80   Pulse 91   Temp 97.7 F (36.5 C) (Temporal)   Ht 5' 5 (1.651 m)   Wt 175 lb 9.6 oz (79.7 kg)   SpO2 99%   BMI 29.22 kg/m     Physical  Exam  GENERAL: Alert, cooperative, well developed, no acute distress HEENT: Normocephalic, normal oropharynx, moist mucous membranes CHEST: Clear to auscultation bilaterally, no wheezes, rhonchi, or crackles CARDIOVASCULAR: Normal heart rate and rhythm, S1 and S2 normal without murmurs ABDOMEN: Soft, non-tender, non-distended, without organomegaly, normal bowel sounds EXTREMITIES: No cyanosis or edema NEUROLOGICAL: Cranial nerves grossly intact, moves all extremities without gross motor or sensory deficit  Prior labs:   Recent Results (from the past 2160 hours)  POCT glycosylated hemoglobin (Hb A1C)     Status: Abnormal   Collection Time: 07/02/23  2:17 PM  Result Value Ref Range   Hemoglobin A1C 7.5 (A) 4.0 - 5.6 %   HbA1c POC (<> result, manual entry)     HbA1c, POC (prediabetic range)     HbA1c, POC (controlled diabetic range)      Lab Results  Component Value Date   CHOL 126 04/28/2022   Lab Results  Component Value Date   HDL 45.80 04/28/2022   No results found for: [facility] Lab Results  Component Value Date   TRIG 211.0 (H) 04/28/2022   Lab Results  Component Value Date   CHOLHDL 3 04/28/2022   Lab Results  Component Value Date    LDLDIRECT 60.0 04/28/2022    Last metabolic panel Lab Results  Component Value Date   GLUCOSE 149 (H) 04/28/2022   NA 143 04/28/2022   K 4.2 04/28/2022   CL 108 04/28/2022   CO2 25 04/28/2022   BUN 18 04/28/2022   CREATININE 1.01 04/28/2022   GFR 80.40 04/28/2022   CALCIUM  9.2 04/28/2022   PROT 7.8 04/28/2022   ALBUMIN 4.5 04/28/2022   BILITOT 0.5 04/28/2022   ALKPHOS 51 04/28/2022   AST 28 04/28/2022   ALT 19 04/28/2022   ANIONGAP 12 12/28/2015    Lab Results  Component Value Date   HGBA1C 7.5 (A) 07/02/2023    Last CBC Lab Results  Component Value Date   WBC 7.7 05/12/2022   HGB 10.8 (L) 05/12/2022   HCT 34.1 (L) 05/12/2022   MCV 73.2 (L) 05/12/2022   MCH 27.8 12/28/2015   RDW 18.5 (H) 05/12/2022   PLT 210.0 05/12/2022    Lab Results  Component Value Date   TSH 1.39 04/28/2022    Lab Results  Component Value Date   PSA 1.66 04/28/2022    Last vitamin D  No results found for: Lucetta Russel, VD25OH  Lab Results  Component Value Date   COLORU yellow 04/01/2014   CLARITYU clear 04/01/2014   GLUCOSEUR 500 04/01/2014   BILIRUBINUR NEGATIVE 04/28/2022   KETONESU neg 04/01/2014   SPECGRAV 1.010 04/01/2014   RBCUR neg 04/01/2014   PHUR 5.5 04/01/2014   PROTEINUR trace 04/01/2014   UROBILINOGEN 1.0 04/28/2022   LEUKOCYTESUR NEGATIVE 04/28/2022    Lab Results  Component Value Date   MICROALBUR 16.3 (H) 04/28/2022      Physical Exam  No results found.  Recent Results (from the past 2160 hours)  POCT glycosylated hemoglobin (Hb A1C)     Status: Abnormal   Collection Time: 07/02/23  2:17 PM  Result Value Ref Range   Hemoglobin A1C 7.5 (A) 4.0 - 5.6 %   HbA1c POC (<> result, manual entry)     HbA1c, POC (prediabetic range)     HbA1c, POC (controlled diabetic range)          Carnell Christian, MD, MS

## 2023-07-11 ENCOUNTER — Encounter: Payer: Self-pay | Admitting: Family Medicine

## 2023-08-30 ENCOUNTER — Telehealth: Payer: Self-pay

## 2023-08-30 DIAGNOSIS — E119 Type 2 diabetes mellitus without complications: Secondary | ICD-10-CM

## 2023-08-30 DIAGNOSIS — I10 Essential (primary) hypertension: Secondary | ICD-10-CM

## 2023-08-30 MED ORDER — LISINOPRIL-HYDROCHLOROTHIAZIDE 10-12.5 MG PO TABS: 1.0000 | ORAL_TABLET | Freq: Every day | ORAL | 1 refills | Status: AC

## 2023-08-30 MED ORDER — METFORMIN HCL 1000 MG PO TABS: 1000.0000 mg | ORAL_TABLET | Freq: Two times a day (BID) | ORAL | 1 refills | Status: AC

## 2023-08-30 NOTE — Telephone Encounter (Signed)
 Copied from CRM [id number]. Topic: Clinical - Prescription Issue >> Aug 29, 2023  2:45 PM Lavanda D wrote: Reason for CRM: Crystal with [employer] calling because Albert Collins insurance will only cover a 90 day supply through [facility] and since the patient is also on the road he would like the 90 day supply. The pharmacy is requesting a new rx for the following to be sent to [facility]. [facility]Start number for provider is below, or it can be faxed.  metFORMIN (GLUCOPHAGE) 1000 MG tablet lisinopril-hydrochlorothiazide (ZESTORETIC) 10-12.5 MG tablet   Phone FastStart #: [phone] Fax: [phone]

## 2023-08-30 NOTE — Telephone Encounter (Signed)
 [facility].

## 2023-10-08 ENCOUNTER — Ambulatory Visit: Admitting: Family Medicine

## 2023-10-09 ENCOUNTER — Ambulatory Visit: Admitting: Family Medicine

## 2023-10-23 ENCOUNTER — Telehealth: Payer: Self-pay | Admitting: Family Medicine

## 2023-10-23 ENCOUNTER — Encounter: Payer: Self-pay | Admitting: Family Medicine

## 2023-10-23 NOTE — Telephone Encounter (Signed)
 Missed visits: 10/27/2022, 06/18/2023, and 10/09/2023  Dismissal generated and being sent via mail.
